# Patient Record
Sex: Male | Born: 1945 | Race: White | Hispanic: No | Marital: Single
Health system: Southern US, Community
[De-identification: ages and names within clinical notes are randomized; demographics above are authoritative.]

## PROBLEM LIST (undated history)

## (undated) DIAGNOSIS — M1990 Unspecified osteoarthritis, unspecified site: Secondary | ICD-10-CM

## (undated) DIAGNOSIS — R569 Unspecified convulsions: Secondary | ICD-10-CM

## (undated) DIAGNOSIS — I10 Essential (primary) hypertension: Secondary | ICD-10-CM

## (undated) DIAGNOSIS — K219 Gastro-esophageal reflux disease without esophagitis: Secondary | ICD-10-CM

## (undated) DIAGNOSIS — C649 Malignant neoplasm of unspecified kidney, except renal pelvis: Secondary | ICD-10-CM

## (undated) HISTORY — PX: NEPHRECTOMY: SHX65

## (undated) HISTORY — PX: TONSILLECTOMY: SUR1361

---

## 2011-10-16 ENCOUNTER — Emergency Department (INDEPENDENT_AMBULATORY_CARE_PROVIDER_SITE_OTHER): Payer: Medicare Other

## 2011-10-16 ENCOUNTER — Emergency Department (HOSPITAL_BASED_OUTPATIENT_CLINIC_OR_DEPARTMENT_OTHER)
Admission: EM | Admit: 2011-10-16 | Discharge: 2011-10-16 | Disposition: A | Discharge status: Home / Self Care | Payer: Medicare Other | Attending: Emergency Medicine | Admitting: Emergency Medicine

## 2011-10-16 ENCOUNTER — Encounter: Payer: Self-pay | Admitting: Student

## 2011-10-16 DIAGNOSIS — R059 Cough, unspecified: Secondary | ICD-10-CM | POA: Insufficient documentation

## 2011-10-16 DIAGNOSIS — R05 Cough: Secondary | ICD-10-CM | POA: Insufficient documentation

## 2011-10-16 DIAGNOSIS — J40 Bronchitis, not specified as acute or chronic: Secondary | ICD-10-CM | POA: Insufficient documentation

## 2011-10-16 DIAGNOSIS — IMO0001 Reserved for inherently not codable concepts without codable children: Secondary | ICD-10-CM | POA: Insufficient documentation

## 2011-10-16 DIAGNOSIS — F17200 Nicotine dependence, unspecified, uncomplicated: Secondary | ICD-10-CM | POA: Insufficient documentation

## 2011-10-16 HISTORY — DX: Malignant neoplasm of unspecified kidney, except renal pelvis: C64.9

## 2011-10-16 HISTORY — DX: Unspecified convulsions: R56.9

## 2011-10-16 HISTORY — DX: Unspecified osteoarthritis, unspecified site: M19.90

## 2011-10-16 HISTORY — DX: Gastro-esophageal reflux disease without esophagitis: K21.9

## 2011-10-16 HISTORY — DX: Essential (primary) hypertension: I10

## 2011-10-16 MED ORDER — HYDROCOD POLST-CHLORPHEN POLST 10-8 MG/5ML PO LQCR: 5.0000 mL | Freq: Two times a day (BID) | ORAL | Status: DC | PRN

## 2011-10-16 NOTE — ED Notes (Signed)
Cough, congestion and URI s/sx since Sunday. Reports body aches chills and generalized flu like s/sx.

## 2011-10-16 NOTE — ED Provider Notes (Signed)
History     CSN: [id number] Arrival date & time: 10/16/2011  6:43 PM   First MD Initiated Contact with Patient 10/16/11 1848      Chief Complaint  Patient presents with  . URI    (Consider location/radiation/quality/duration/timing/severity/associated sxs/prior treatment) Patient is a 65 y.o. male presenting with URI. The history is provided by the patient. No language interpreter was used.  URI The primary symptoms include sore throat, cough and myalgias. Primary symptoms do not include fever, nausea or vomiting. The current episode started 2 days ago. This is a new problem. The problem has not changed since onset. Symptoms associated with the illness include congestion.    Past Medical History  Diagnosis Date  . Hypertension   . GERD (gastroesophageal reflux disease)   . Seizure   . Glaucoma   . Arthritis   . Renal cancer     Past Surgical History  Procedure Date  . Tonsillectomy   . Nephrectomy     Right Kidney    History reviewed. No pertinent family history.  History  Substance Use Topics  . Smoking status: Current Everyday Smoker  . Smokeless tobacco: Not on file  . Alcohol Use: Yes      Review of Systems  Constitutional: Negative for fever.  HENT: Positive for congestion and sore throat.   Respiratory: Positive for cough.   Gastrointestinal: Negative for nausea and vomiting.  Musculoskeletal: Positive for myalgias.  All other systems reviewed and are negative.    Allergies  Penicillins  Home Medications  No current outpatient prescriptions on file.  BP 139/76  Pulse 83  Temp(Src) 98.9 F (37.2 C) (Oral)  Resp 20  Wt 165 lb (74.844 kg)  SpO2 97%  Physical Exam  Nursing note and vitals reviewed. Constitutional: He is oriented to person, place, and time. He appears well-developed and well-nourished.  HENT:  Head: Normocephalic and atraumatic.  Right Ear: External ear normal.  Left Ear: External ear normal.  Cardiovascular: Normal  rate and regular rhythm.   Pulmonary/Chest: Effort normal and breath sounds normal.       Persistent coughing noted on exam  Musculoskeletal: Normal range of motion.  Neurological: He is alert and oriented to person, place, and time.  Skin: Skin is warm and dry.  Psychiatric: He has a normal mood and affect.    ED Course  Procedures (including critical care time)  Labs Reviewed - No data to display Dg Chest 2 View  10/16/2011  *RADIOLOGY REPORT*  Clinical Data: Cough.  CHEST - 2 VIEW  Comparison: None  Findings: The cardiac silhouette, mediastinal and hilar contours are within normal limits.  There are mild bronchitic type lung changes which could reflect bronchitis.  No focal infiltrates, edema or effusions.  The bony thorax is intact.  IMPRESSION: Bronchitic lung changes suggesting bronchitis.  No focal infiltrates.  Original Report Authenticated By: P. Loralie Champagne, M.D.     1. Bronchitis       MDM  pts vital stable:will treat for bronchitis        Teressa Lower, NP 10/16/11 1930  Teressa Lower, NP 10/16/11 1931

## 2011-10-17 NOTE — ED Provider Notes (Signed)
Medical screening examination/treatment/procedure(s) were performed by non-physician practitioner and as supervising physician I was immediately available for consultation/collaboration.    Desera Graffeo, MD 10/17/11 0021 

## 2011-12-10 ENCOUNTER — Encounter (HOSPITAL_BASED_OUTPATIENT_CLINIC_OR_DEPARTMENT_OTHER): Payer: Self-pay | Admitting: Family Medicine

## 2011-12-10 ENCOUNTER — Emergency Department (HOSPITAL_BASED_OUTPATIENT_CLINIC_OR_DEPARTMENT_OTHER)
Admission: EM | Admit: 2011-12-10 | Discharge: 2011-12-10 | Disposition: A | Discharge status: Home / Self Care | Payer: Medicare Other | Attending: Emergency Medicine | Admitting: Emergency Medicine

## 2011-12-10 ENCOUNTER — Emergency Department (INDEPENDENT_AMBULATORY_CARE_PROVIDER_SITE_OTHER): Payer: Medicare Other

## 2011-12-10 DIAGNOSIS — M549 Dorsalgia, unspecified: Secondary | ICD-10-CM | POA: Insufficient documentation

## 2011-12-10 DIAGNOSIS — M545 Low back pain: Secondary | ICD-10-CM

## 2011-12-10 DIAGNOSIS — F17200 Nicotine dependence, unspecified, uncomplicated: Secondary | ICD-10-CM | POA: Insufficient documentation

## 2011-12-10 DIAGNOSIS — Z8739 Personal history of other diseases of the musculoskeletal system and connective tissue: Secondary | ICD-10-CM | POA: Insufficient documentation

## 2011-12-10 DIAGNOSIS — K219 Gastro-esophageal reflux disease without esophagitis: Secondary | ICD-10-CM | POA: Insufficient documentation

## 2011-12-10 DIAGNOSIS — I10 Essential (primary) hypertension: Secondary | ICD-10-CM | POA: Insufficient documentation

## 2011-12-10 DIAGNOSIS — Z85528 Personal history of other malignant neoplasm of kidney: Secondary | ICD-10-CM

## 2011-12-10 MED ORDER — OXYCODONE-ACETAMINOPHEN 5-325 MG PO TABS: 2.0000 | ORAL_TABLET | ORAL | Status: AC | PRN

## 2011-12-10 MED ORDER — DIAZEPAM 5 MG PO TABS: 5.0000 mg | ORAL_TABLET | Freq: Two times a day (BID) | ORAL | Status: AC

## 2011-12-10 MED ORDER — IBUPROFEN 800 MG PO TABS: 800.0000 mg | ORAL_TABLET | Freq: Three times a day (TID) | ORAL | Status: AC

## 2011-12-10 MED ORDER — KETOROLAC TROMETHAMINE 60 MG/2ML IM SOLN
60.0000 mg | Freq: Once | INTRAMUSCULAR | Status: AC
  Administered 2011-12-10: 60 mg via INTRAMUSCULAR
  Filled 2011-12-10: qty 2

## 2011-12-10 MED ORDER — OXYCODONE-ACETAMINOPHEN 5-325 MG PO TABS
2.0000 | ORAL_TABLET | Freq: Once | ORAL | Status: AC
  Administered 2011-12-10: 2 via ORAL
  Filled 2011-12-10: qty 2

## 2011-12-10 MED ORDER — DIAZEPAM 5 MG PO TABS
5.0000 mg | ORAL_TABLET | Freq: Once | ORAL | Status: AC
  Administered 2011-12-10: 5 mg via ORAL
  Filled 2011-12-10: qty 1

## 2011-12-10 NOTE — ED Notes (Signed)
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

## 2011-12-10 NOTE — ED Provider Notes (Signed)
History   This chart was scribed for Glynn Octave, MD by Melba Coon. The patient was seen in room MH08/MH08 and the patient's care was started at 6:20PM.    CSN: [id number]  Arrival date & time 12/10/11  1800   First MD Initiated Contact with Patient 12/10/11 1814      Chief Complaint  Patient presents with  . Back Pain    (Consider location/radiation/quality/duration/timing/severity/associated sxs/prior treatment) HPI Peter Sharp is a 66 y.o. male who presents to the Emergency Department complaining of constant, non-radiating, moderate to severe lower right back pain with an onset 2 days ago. Pt woke up the morning of onset and could not physically get out of bed without pain in back. Pt states that he has had similar pain before about 30 years when he was lifting heavy items. Pt's occupation involves lots of standing, movement and energy. Laying down and ice pads makes it better. Pt is ambulatory with pain. No falls, recent injuries, n/v/d, chest pain, stomach pain, buttock pain, leg pain, fever, or loss of bowel movements. Pt has a Hx of GERD, glaucoma, right kidney CA (localized; no recent CA treatment (6 month checkups)).   Past Medical History  Diagnosis Date  . Hypertension   . GERD (gastroesophageal reflux disease)   . Seizure   . Glaucoma   . Arthritis   . Renal cancer     Past Surgical History  Procedure Date  . Tonsillectomy   . Nephrectomy     Right Kidney    No family history on file.  History  Substance Use Topics  . Smoking status: Current Everyday Smoker  . Smokeless tobacco: Not on file  . Alcohol Use: Yes      Review of Systems 10 Systems reviewed and are negative for acute change except as noted in the HPI.  Allergies  Penicillins  Home Medications   Current Outpatient Rx  Name Route Sig Dispense Refill  . PRESCRIPTION MEDICATION  Patient takes unknown glaucoma, seizure, high blood pressure, arthritis and acid reflux medications     . DIAZEPAM 5 MG PO TABS Oral Take 1 tablet (5 mg total) by mouth 2 (two) times daily. 10 tablet 0  . IBUPROFEN 800 MG PO TABS Oral Take 1 tablet (800 mg total) by mouth 3 (three) times daily. 21 tablet 0  . OXYCODONE-ACETAMINOPHEN 5-325 MG PO TABS Oral Take 2 tablets by mouth every 4 (four) hours as needed for pain. 15 tablet 0    BP 155/87  Pulse 108  Temp(Src) 98.2 F (36.8 C) (Oral)  Resp 18  Ht 5\' 6"  (1.676 m)  Wt 170 lb (77.111 kg)  BMI 27.44 kg/m2  SpO2 99%  Physical Exam  Constitutional: He is oriented to person, place, and time. He appears well-developed and well-nourished.  HENT:  Head: Normocephalic and atraumatic.  Eyes: Conjunctivae and EOM are normal. Pupils are equal, round, and reactive to light. No scleral icterus.  Neck: Normal range of motion. Neck supple. No thyromegaly present.  Cardiovascular: Normal rate, regular rhythm and normal heart sounds.  Exam reveals no gallop and no friction rub.   No murmur heard. Pulmonary/Chest: Effort normal and breath sounds normal. No stridor. He has no wheezes. He has no rales. He exhibits no tenderness.  Abdominal: Soft. He exhibits no distension. There is no tenderness. There is no rebound.  Musculoskeletal: Normal range of motion. He exhibits tenderness (Rt paraspinal tenderness). He exhibits no edema.       Good dorsiflexion 2+  patellar and achilles reflexes No CVA tenderness Good PTs and DTs   Lymphadenopathy:    He has no cervical adenopathy.  Neurological: He is oriented to person, place, and time. Coordination normal.  Skin: No rash noted. No erythema.  Psychiatric: He has a normal mood and affect. His behavior is normal.    ED Course  Procedures (including critical care time)  DIAGNOSTIC STUDIES: Oxygen Saturation is 99% on room air, normal by my interpretation.    COORDINATION OF CARE:  7:17PM - EDMD informs pt of nml XRs; pt states that back pain is better than previously; plan for d/c; pt  agrees   Labs Reviewed - No data to display Dg Lumbar Spine Complete  12/10/2011  *RADIOLOGY REPORT*  Clinical Data: Low back pain since Saturday.  History renal cancer.  LUMBAR SPINE - COMPLETE 4+ VIEW  Comparison: Chest film of 10/16/2011.  Findings: Five lumbar type vertebral bodies.   Surgical clips in the right side of the abdomen. Minimal  wedging of T11 and T12 suspected.  Lumbar vertebral body height is maintained.  Expected for age lower lumbar spondylosis, including endplate osteophytes at L4-L5 facet arthropathy.  IMPRESSION:  1.  No acute findings in the lumbar spine.  Normal for age spondylosis. 2.  Minimal wedging of the T11 and T12 vertebral bodies suspected. Suboptimally evaluated secondary to the film being centered over the mid lumbar spine.  Correlate with thoracolumbar junction symptoms.  These may be present on the 10/16/2011 exam.  No canal compromise.  Original Report Authenticated By: Consuello Bossier, M.D.     1. Back pain       MDM  Right paraspinal low back pain for the past 3 days. Pain does not radiate he is not had back pain for 30 years but thinks he might of done something while working outside. Neurological intact,  We'll obtain plain film imaging given remote kidney cancer history.  Pain improved medications. Patient is ambulatory. No evidence of metastases on Xray.   I personally performed the services described in this documentation, which was scribed in my presence.  The recorded information has been reviewed and considered.      Glynn Octave, MD 12/11/11 [phone]

## 2011-12-10 NOTE — ED Notes (Addendum)
Pt c/o low right back pain since Saturday. Pt able to ambulate with difficulty. Pt reports h/o back pain.

## 2020-02-16 ENCOUNTER — Emergency Department (HOSPITAL_COMMUNITY): Payer: No Typology Code available for payment source

## 2020-02-16 ENCOUNTER — Inpatient Hospital Stay (HOSPITAL_COMMUNITY)
Admission: EM | Admit: 2020-02-16 | Discharge: 2020-02-19 | DRG: 084 | Disposition: A | Discharge status: Home / Self Care | Payer: No Typology Code available for payment source | Attending: Surgery | Admitting: Surgery

## 2020-02-16 DIAGNOSIS — I619 Nontraumatic intracerebral hemorrhage, unspecified: Secondary | ICD-10-CM | POA: Diagnosis not present

## 2020-02-16 DIAGNOSIS — Y93E1 Activity, personal bathing and showering: Secondary | ICD-10-CM

## 2020-02-16 DIAGNOSIS — Y92002 Bathroom of unspecified non-institutional (private) residence single-family (private) house as the place of occurrence of the external cause: Secondary | ICD-10-CM

## 2020-02-16 DIAGNOSIS — G4733 Obstructive sleep apnea (adult) (pediatric): Secondary | ICD-10-CM | POA: Diagnosis present

## 2020-02-16 DIAGNOSIS — I10 Essential (primary) hypertension: Secondary | ICD-10-CM | POA: Diagnosis present

## 2020-02-16 DIAGNOSIS — Z95810 Presence of automatic (implantable) cardiac defibrillator: Secondary | ICD-10-CM

## 2020-02-16 DIAGNOSIS — S0181XA Laceration without foreign body of other part of head, initial encounter: Secondary | ICD-10-CM | POA: Diagnosis present

## 2020-02-16 DIAGNOSIS — T1490XA Injury, unspecified, initial encounter: Secondary | ICD-10-CM

## 2020-02-16 DIAGNOSIS — Z85528 Personal history of other malignant neoplasm of kidney: Secondary | ICD-10-CM

## 2020-02-16 DIAGNOSIS — S0232XA Fracture of orbital floor, left side, initial encounter for closed fracture: Secondary | ICD-10-CM | POA: Diagnosis present

## 2020-02-16 DIAGNOSIS — Z96653 Presence of artificial knee joint, bilateral: Secondary | ICD-10-CM | POA: Diagnosis present

## 2020-02-16 DIAGNOSIS — Z20822 Contact with and (suspected) exposure to covid-19: Secondary | ICD-10-CM | POA: Diagnosis present

## 2020-02-16 DIAGNOSIS — S06359A Traumatic hemorrhage of left cerebrum with loss of consciousness of unspecified duration, initial encounter: Principal | ICD-10-CM | POA: Diagnosis present

## 2020-02-16 DIAGNOSIS — Z7901 Long term (current) use of anticoagulants: Secondary | ICD-10-CM

## 2020-02-16 DIAGNOSIS — J449 Chronic obstructive pulmonary disease, unspecified: Secondary | ICD-10-CM | POA: Diagnosis present

## 2020-02-16 DIAGNOSIS — Z79899 Other long term (current) drug therapy: Secondary | ICD-10-CM

## 2020-02-16 DIAGNOSIS — Z880 Allergy status to penicillin: Secondary | ICD-10-CM

## 2020-02-16 DIAGNOSIS — I4891 Unspecified atrial fibrillation: Secondary | ICD-10-CM | POA: Diagnosis present

## 2020-02-16 DIAGNOSIS — H409 Unspecified glaucoma: Secondary | ICD-10-CM | POA: Diagnosis present

## 2020-02-16 DIAGNOSIS — S01112A Laceration without foreign body of left eyelid and periocular area, initial encounter: Secondary | ICD-10-CM | POA: Diagnosis present

## 2020-02-16 LAB — CBC
HCT: 38.5 % — ABNORMAL LOW (ref 39.0–52.0)
Hemoglobin: 12.3 g/dL — ABNORMAL LOW (ref 13.0–17.0)
MCH: 35.1 pg — ABNORMAL HIGH (ref 26.0–34.0)
MCHC: 31.9 g/dL (ref 30.0–36.0)
MCV: 110 fL — ABNORMAL HIGH (ref 80.0–100.0)
Platelets: 200 10*3/uL (ref 150–400)
RBC: 3.5 MIL/uL — ABNORMAL LOW (ref 4.22–5.81)
RDW: 12.7 % (ref 11.5–15.5)
WBC: 5.5 10*3/uL (ref 4.0–10.5)
nRBC: 0 % (ref 0.0–0.2)

## 2020-02-16 LAB — I-STAT CHEM 8, ED
BUN: 26 mg/dL — ABNORMAL HIGH (ref 8–23)
Calcium, Ion: 1.14 mmol/L — ABNORMAL LOW (ref 1.15–1.40)
Chloride: 105 mmol/L (ref 98–111)
Creatinine, Ser: 1.8 mg/dL — ABNORMAL HIGH (ref 0.61–1.24)
Glucose, Bld: 105 mg/dL — ABNORMAL HIGH (ref 70–99)
HCT: 37 % — ABNORMAL LOW (ref 39.0–52.0)
Hemoglobin: 12.6 g/dL — ABNORMAL LOW (ref 13.0–17.0)
Potassium: 4 mmol/L (ref 3.5–5.1)
Sodium: 141 mmol/L (ref 135–145)
TCO2: 30 mmol/L (ref 22–32)

## 2020-02-16 LAB — SAMPLE TO BLOOD BANK

## 2020-02-16 MED ORDER — FENTANYL CITRATE (PF) 100 MCG/2ML IJ SOLN
100.0000 ug | Freq: Once | INTRAMUSCULAR | Status: AC
  Administered 2020-02-16: 100 ug via INTRAVENOUS
  Filled 2020-02-16: qty 2
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
## 2020-02-17 ENCOUNTER — Other Ambulatory Visit: Payer: Self-pay

## 2020-02-17 ENCOUNTER — Emergency Department (HOSPITAL_COMMUNITY): Payer: No Typology Code available for payment source

## 2020-02-17 ENCOUNTER — Encounter (HOSPITAL_COMMUNITY): Payer: Self-pay | Admitting: Radiology

## 2020-02-17 ENCOUNTER — Inpatient Hospital Stay (HOSPITAL_COMMUNITY): Payer: No Typology Code available for payment source

## 2020-02-17 DIAGNOSIS — G4733 Obstructive sleep apnea (adult) (pediatric): Secondary | ICD-10-CM | POA: Diagnosis present

## 2020-02-17 DIAGNOSIS — J449 Chronic obstructive pulmonary disease, unspecified: Secondary | ICD-10-CM | POA: Diagnosis present

## 2020-02-17 DIAGNOSIS — H409 Unspecified glaucoma: Secondary | ICD-10-CM | POA: Diagnosis present

## 2020-02-17 DIAGNOSIS — S0232XA Fracture of orbital floor, left side, initial encounter for closed fracture: Secondary | ICD-10-CM | POA: Diagnosis present

## 2020-02-17 DIAGNOSIS — Z20822 Contact with and (suspected) exposure to covid-19: Secondary | ICD-10-CM | POA: Diagnosis present

## 2020-02-17 DIAGNOSIS — S01112A Laceration without foreign body of left eyelid and periocular area, initial encounter: Secondary | ICD-10-CM | POA: Diagnosis present

## 2020-02-17 DIAGNOSIS — Y92002 Bathroom of unspecified non-institutional (private) residence single-family (private) house as the place of occurrence of the external cause: Secondary | ICD-10-CM | POA: Diagnosis not present

## 2020-02-17 DIAGNOSIS — I10 Essential (primary) hypertension: Secondary | ICD-10-CM | POA: Diagnosis present

## 2020-02-17 DIAGNOSIS — I619 Nontraumatic intracerebral hemorrhage, unspecified: Secondary | ICD-10-CM | POA: Diagnosis present

## 2020-02-17 DIAGNOSIS — T1490XA Injury, unspecified, initial encounter: Secondary | ICD-10-CM | POA: Diagnosis present

## 2020-02-17 DIAGNOSIS — Z96653 Presence of artificial knee joint, bilateral: Secondary | ICD-10-CM | POA: Diagnosis present

## 2020-02-17 DIAGNOSIS — I4891 Unspecified atrial fibrillation: Secondary | ICD-10-CM | POA: Diagnosis present

## 2020-02-17 DIAGNOSIS — Z95810 Presence of automatic (implantable) cardiac defibrillator: Secondary | ICD-10-CM | POA: Diagnosis not present

## 2020-02-17 DIAGNOSIS — Z880 Allergy status to penicillin: Secondary | ICD-10-CM | POA: Diagnosis not present

## 2020-02-17 DIAGNOSIS — Z7901 Long term (current) use of anticoagulants: Secondary | ICD-10-CM | POA: Diagnosis not present

## 2020-02-17 DIAGNOSIS — S0181XA Laceration without foreign body of other part of head, initial encounter: Secondary | ICD-10-CM | POA: Diagnosis present

## 2020-02-17 DIAGNOSIS — Z85528 Personal history of other malignant neoplasm of kidney: Secondary | ICD-10-CM | POA: Diagnosis not present

## 2020-02-17 DIAGNOSIS — Y93E1 Activity, personal bathing and showering: Secondary | ICD-10-CM | POA: Diagnosis not present

## 2020-02-17 DIAGNOSIS — Z79899 Other long term (current) drug therapy: Secondary | ICD-10-CM | POA: Diagnosis not present

## 2020-02-17 LAB — URINALYSIS, ROUTINE W REFLEX MICROSCOPIC
Bilirubin Urine: NEGATIVE
Glucose, UA: NEGATIVE mg/dL
Ketones, ur: NEGATIVE mg/dL
Leukocytes,Ua: NEGATIVE
Nitrite: NEGATIVE
Protein, ur: NEGATIVE mg/dL
Specific Gravity, Urine: 1.019 (ref 1.005–1.030)
pH: 5 (ref 5.0–8.0)

## 2020-02-17 LAB — PROTIME-INR
INR: 1.4 — ABNORMAL HIGH (ref 0.8–1.2)
Prothrombin Time: 16.8 seconds — ABNORMAL HIGH (ref 11.4–15.2)

## 2020-02-17 LAB — COMPREHENSIVE METABOLIC PANEL
ALT: 7 U/L (ref 0–44)
AST: 18 U/L (ref 15–41)
Albumin: 4 g/dL (ref 3.5–5.0)
Alkaline Phosphatase: 89 U/L (ref 38–126)
Anion gap: 11 (ref 5–15)
BUN: 21 mg/dL (ref 8–23)
CO2: 25 mmol/L (ref 22–32)
Calcium: 8.6 mg/dL — ABNORMAL LOW (ref 8.9–10.3)
Chloride: 103 mmol/L (ref 98–111)
Creatinine, Ser: 1.68 mg/dL — ABNORMAL HIGH (ref 0.61–1.24)
GFR calc Af Amer: 46 mL/min — ABNORMAL LOW (ref >60)
GFR calc non Af Amer: 39 mL/min — ABNORMAL LOW (ref >60)
Glucose, Bld: 106 mg/dL — ABNORMAL HIGH (ref 70–99)
Potassium: 4.1 mmol/L (ref 3.5–5.1)
Sodium: 139 mmol/L (ref 135–145)
Total Bilirubin: 0.6 mg/dL (ref 0.3–1.2)
Total Protein: 7 g/dL (ref 6.5–8.1)

## 2020-02-17 LAB — ETHANOL: Alcohol, Ethyl (B): <10 mg/dL (ref <10)

## 2020-02-17 LAB — RESPIRATORY PANEL BY RT PCR (FLU A&B, COVID)
Influenza A by PCR: NEGATIVE
Influenza B by PCR: NEGATIVE
SARS Coronavirus 2 by RT PCR: NEGATIVE

## 2020-02-17 LAB — LACTIC ACID, PLASMA: Lactic Acid, Venous: 0.8 mmol/L (ref 0.5–1.9)

## 2020-02-17 LAB — MRSA PCR SCREENING: MRSA by PCR: NEGATIVE

## 2020-02-17 MED ORDER — OXYCODONE HCL 5 MG PO TABS
5.0000 mg | ORAL_TABLET | ORAL | Status: DC | PRN
  Administered 2020-02-17: 10 mg via ORAL
  Filled 2020-02-17: qty 2

## 2020-02-17 MED ORDER — ONDANSETRON 4 MG PO TBDP: 4.0000 mg | ORAL_TABLET | Freq: Four times a day (QID) | ORAL | Status: DC | PRN

## 2020-02-17 MED ORDER — LIDOCAINE-EPINEPHRINE (PF) 2 %-1:200000 IJ SOLN
20.0000 mL | Freq: Once | INTRAMUSCULAR | Status: DC
  Filled 2020-02-17: qty 20

## 2020-02-17 MED ORDER — LEVETIRACETAM IN NACL 500 MG/100ML IV SOLN
500.0000 mg | Freq: Two times a day (BID) | INTRAVENOUS | Status: DC
  Administered 2020-02-17 – 2020-02-19 (×4): 500 mg via INTRAVENOUS
  Filled 2020-02-17 (×4): qty 100

## 2020-02-17 MED ORDER — MORPHINE SULFATE (PF) 2 MG/ML IV SOLN: 2.0000 mg | INTRAVENOUS | Status: DC | PRN

## 2020-02-17 MED ORDER — ONDANSETRON HCL 4 MG/2ML IJ SOLN: 4.0000 mg | Freq: Four times a day (QID) | INTRAMUSCULAR | Status: DC | PRN

## 2020-02-17 MED ORDER — MORPHINE SULFATE (PF) 4 MG/ML IV SOLN
4.0000 mg | Freq: Once | INTRAVENOUS | Status: AC
  Administered 2020-02-17: 4 mg via INTRAVENOUS
  Filled 2020-02-17: qty 1

## 2020-02-17 MED ORDER — DOCUSATE SODIUM 100 MG PO CAPS
100.0000 mg | ORAL_CAPSULE | Freq: Two times a day (BID) | ORAL | Status: DC
  Filled 2020-02-17 (×2): qty 1

## 2020-02-17 MED ORDER — LEVETIRACETAM IN NACL 1000 MG/100ML IV SOLN
1000.0000 mg | Freq: Once | INTRAVENOUS | Status: AC
  Administered 2020-02-17: 1000 mg via INTRAVENOUS
  Filled 2020-02-17: qty 100

## 2020-02-17 MED ORDER — METHOCARBAMOL 500 MG PO TABS
1000.0000 mg | ORAL_TABLET | Freq: Three times a day (TID) | ORAL | Status: DC
  Administered 2020-02-17 – 2020-02-19 (×6): 1000 mg via ORAL
  Filled 2020-02-17 (×6): qty 2

## 2020-02-17 MED ORDER — SODIUM CHLORIDE 0.9 % IV SOLN: INTRAVENOUS | Status: DC

## 2020-02-17 MED ORDER — ACETAMINOPHEN 500 MG PO TABS
1000.0000 mg | ORAL_TABLET | Freq: Four times a day (QID) | ORAL | Status: DC
  Administered 2020-02-17 – 2020-02-19 (×6): 1000 mg via ORAL
  Filled 2020-02-17 (×7): qty 2

## 2020-02-17 MED ORDER — TETANUS-DIPHTH-ACELL PERTUSSIS 5-2.5-18.5 LF-MCG/0.5 IM SUSP
0.5000 mL | Freq: Once | INTRAMUSCULAR | Status: AC
  Administered 2020-02-17: 0.5 mL via INTRAMUSCULAR
  Filled 2020-02-17: qty 0.5

## 2020-02-17 MED ORDER — ORAL CARE MOUTH RINSE
15.0000 mL | Freq: Two times a day (BID) | OROMUCOSAL | Status: DC
  Administered 2020-02-17 – 2020-02-18 (×2): 15 mL via OROMUCOSAL

## 2020-02-17 MED ORDER — CHLORHEXIDINE GLUCONATE CLOTH 2 % EX PADS
6.0000 | MEDICATED_PAD | Freq: Every day | CUTANEOUS | Status: DC
  Administered 2020-02-17 – 2020-02-18 (×2): 6 via TOPICAL
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
## 2020-02-17 NOTE — ED Notes (Addendum)
Phoned report to receiving RN. Will arrange transport shortly.

## 2020-02-17 NOTE — Social Work (Signed)
CSW met with pt at bedside. CSW introduced self and explained role. CSW completed SBIRT with pt. Pt scored a 4 on the Sbirt scale. Pt stated that he has 1-2 beers most days out of the week with his dinner. Pt stated in the past he was an alcoholic but was sober for 2 years and now drinks to relax. Pt stated that he does not have a problem with his alcohol use. CSW offered resources and pt declined.   Peter Sharp, Latanya Presser, [place] Licensed Clinical Social Worker [phone]
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
## 2020-02-18 NOTE — Discharge Instructions (Signed)
Head Injury, Adult There are many types of head injuries. They can be as minor as a bump. Some head injuries can be worse. Worse injuries include:  A strong hit to the head that shakes the brain back and forth causing damage (concussion).  A bruise (contusion) of the brain. This means there is bleeding in the brain that can cause swelling.  A cracked skull (skull fracture).  Bleeding in the brain that gathers, gets thick (makes a clot), and forms a bump (hematoma). Most problems from a head injury come in the first 24 hours. However, you may still have side effects up to 7-10 days after your injury. It is important to watch your condition for any changes. You may need to be watched in the emergency department or urgent care, or you may need to stay in the hospital. What are the causes? There are many possible causes of a head injury. A serious head injury may be caused by:  A car accident.  Bicycle or motorcycle accidents.  Sports injuries.  Falls. What are the signs or symptoms? Symptoms of a head injury include a bruise, bump, or bleeding where the injury happened. Other physical symptoms may include:  Headache.  Feeling sick to your stomach (nauseous) or vomiting.  Dizziness.  Feeling tired.  Being uncomfortable around bright lights or loud noises.  Shaking movements that you cannot control (seizures).  Trouble being woken up.  Passing out (fainting). Mental or emotional symptoms may include:  Feeling grumpy or cranky.  Confusion and memory problems.  Having trouble paying attention or concentrating.  Changes in eating or sleeping habits.  Feeling worried or nervous (anxious).  Feeling sad (depressed). How is this treated? Treatment for this condition depends on how severe the injury is and the type of injury you have. The main goal is to prevent complications and to allow the brain time to heal. Mild head injury If you have a mild head injury, you may be  sent home and treatment may include:  Being watched. A responsible adult should stay with you for 24 hours after your injury and check on you often.  Physical rest.  Brain rest.  Pain medicines. Severe head injury If you have a severe head injury, treatment may include:  Being watched closely. This includes hospitalization with frequent physical exams.  Medicines to: ? Help with pain. ? Prevent shaking movements that you cannot control. ? Help with brain swelling.  Using a machine that helps you breathe (ventilator).  Treatments to manage the swelling inside the brain.  Brain surgery. This may be needed to: ? Remove a blood clot. ? Stop the bleeding. ? Remove a part of the skull. This allows room for the brain to swell. Follow these instructions at home: Activity  Rest.  Avoid activities that are hard or tiring.  Make sure you get enough sleep.  Limit activities that need a lot of thought or attention, such as: ? Watching TV. ? Playing memory games and puzzles. ? Job-related work or homework. ? Working on the computer, social media, and texting.  Avoid activities that could cause another head injury until your doctor says it is okay. This includes playing sports. Having another head injury, especially before the first one has healed, can be dangerous.  Ask your doctor when it is safe for you to go back to your normal activities, such as work or school. Ask your doctor for a step-by-step plan for slowly going back to your normal activities.  Ask   your doctor when you can drive, ride a bicycle, or use heavy machinery. Do not do these activities if you are dizzy. Lifestyle   Do not drink alcohol until your doctor says it is okay.  Do not use drugs.  If it is harder than usual to remember things, write them down.  If you are easily distracted, try to do one thing at a time.  Talk with family members or close friends when making important decisions.  Tell your  friends, family, a trusted coworker, and work manager about your injury, symptoms, and limits (restrictions). Have them watch for any problems that are new or getting worse. General instructions  Take over-the-counter and prescription medicines only as told by your doctor.  Have someone stay with you for 24 hours after your head injury. This person should watch you for any changes in your symptoms and be ready to get help.  Keep all follow-up visits as told by your doctor. This is important. How is this prevented?  Work on your balance and strength. This can help you avoid falls.  Wear a seatbelt when you are in a moving vehicle.  Wear a helmet when you: ? Ride a bicycle. ? Ski. ? Do any other sport or activity that has a risk of injury.  If you drink alcohol: ? Limit how much you use to:  0-1 drink a day for women.  0-2 drinks a day for men. ? Be aware of how much alcohol is in your drink. In the [place] one drink equals one 12 oz bottle of beer (355 mL), one 5 oz glass of wine (148 mL), or one 1 oz glass of hard liquor (44 mL).  Make your home safer by: ? Getting rid of clutter from the floors and stairs. This includes things that can make you trip. ? Using grab bars in bathrooms and handrails by stairs. ? Placing non-slip mats on floors and in bathtubs. ? Putting more light in dim areas. Get help right away if:  You have: ? A very bad headache that is not helped by medicine. ? Trouble walking or weakness in your arms and legs. ? Clear or bloody fluid coming from your nose or ears. ? Changes in how you see (vision). ? Shaking movements that you cannot control.  You lose your balance.  You vomit.  The black centers of your eyes (pupils) change in size.  Your speech is slurred.  Your dizziness gets worse.  You pass out.  You are sleepier than normal and have trouble staying awake.  Your symptoms get worse. These symptoms may be an emergency. Do not wait to see  if the symptoms will go away. Get medical help right away. Call your local emergency services (911 in the [place] Do not drive yourself to the hospital. Summary  There are many types of head injuries. They can be as minor as a bump. Some head injuries can be worse  Treatment for this condition depends on how severe the injury is and the type of injury you have.  Ask your doctor when it is safe for you to go back to your normal activities, such as work or school.  To prevent a head injury, wear a seat belt in a car, wear a helmet when you use a a bicycle, limit your alcohol use, and make your home safer. This information is not intended to replace advice given to you by your health care provider. Make sure you discuss any questions you   have with your health care provider. Document Revised: 02/26/2019 Document Reviewed: 11/28/2018 Elsevier Patient Education  2020 [employer]  
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
## 2020-02-19 NOTE — Progress Notes (Signed)
Peter Sharp Sr. to be D/C'd  per MD order. Discussed with the patient and all questions fully answered.  VSS, Skin clean, dry and intact without evidence of skin break down, no evidence of skin tears noted.  IV catheter discontinued intact. Site without signs and symptoms of complications. Dressing and pressure applied.  An After Visit Summary was printed and given to the patient. Patient received prescription.  D/c education completed with patient/family including follow up instructions, medication list, d/c activities limitations if indicated, with other d/c instructions as indicated by MD - patient able to verbalize understanding, all questions fully answered.   Patient instructed to return to ED, call 911, or call MD for any changes in condition.   Patient to be escorted via [place], and D/C home via private auto.
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
## 2020-02-19 NOTE — Plan of Care (Signed)
  Problem: Education: Goal: Knowledge of disease or condition will improve Outcome: Progressing   Problem: Skin Integrity: Goal: Risk for impaired skin integrity will decrease Outcome: Progressing Goal: Demonstration of wound healing without infection will improve Outcome: Progressing   Problem: Education: Goal: Knowledge of General Education information will improve Description: Including pain rating scale, medication(s)/side effects and non-pharmacologic comfort measures Outcome: Progressing   Problem: Health Behavior/Discharge Planning: Goal: Ability to manage health-related needs will improve Outcome: Progressing   Problem: Clinical Measurements: Goal: Ability to maintain clinical measurements within normal limits will improve Outcome: Progressing Goal: Will remain free from infection Outcome: Progressing Goal: Diagnostic test results will improve Outcome: Progressing Goal: Respiratory complications will improve Outcome: Progressing Goal: Cardiovascular complication will be avoided Outcome: Progressing   Problem: Activity: Goal: Risk for activity intolerance will decrease Outcome: Progressing   Problem: Nutrition: Goal: Adequate nutrition will be maintained Outcome: Progressing   Problem: Coping: Goal: Level of anxiety will decrease Outcome: Progressing   Problem: Elimination: Goal: Will not experience complications related to bowel motility Outcome: Progressing Goal: Will not experience complications related to urinary retention Outcome: Progressing   Problem: Pain Managment: Goal: General experience of comfort will improve Outcome: Progressing   Problem: Safety: Goal: Ability to remain free from injury will improve Outcome: Progressing   Problem: Skin Integrity: Goal: Risk for impaired skin integrity will decrease Outcome: Progressing

## 2020-02-19 NOTE — Plan of Care (Signed)
  Problem: Education: Goal: Knowledge of disease or condition will improve Outcome: Completed/Met   Problem: Skin Integrity: Goal: Risk for impaired skin integrity will decrease Outcome: Completed/Met Goal: Demonstration of wound healing without infection will improve Outcome: Completed/Met   Problem: Education: Goal: Knowledge of General Education information will improve Description: Including pain rating scale, medication(s)/side effects and non-pharmacologic comfort measures Outcome: Completed/Met   Problem: Health Behavior/Discharge Planning: Goal: Ability to manage health-related needs will improve Outcome: Completed/Met   Problem: Clinical Measurements: Goal: Ability to maintain clinical measurements within normal limits will improve Outcome: Completed/Met Goal: Will remain free from infection Outcome: Completed/Met Goal: Diagnostic test results will improve Outcome: Completed/Met Goal: Respiratory complications will improve Outcome: Completed/Met Goal: Cardiovascular complication will be avoided Outcome: Completed/Met   Problem: Activity: Goal: Risk for activity intolerance will decrease Outcome: Completed/Met   Problem: Nutrition: Goal: Adequate nutrition will be maintained Outcome: Completed/Met   Problem: Coping: Goal: Level of anxiety will decrease Outcome: Completed/Met   Problem: Elimination: Goal: Will not experience complications related to bowel motility Outcome: Completed/Met Goal: Will not experience complications related to urinary retention Outcome: Completed/Met   Problem: Pain Managment: Goal: General experience of comfort will improve Outcome: Completed/Met   Problem: Safety: Goal: Ability to remain free from injury will improve Outcome: Completed/Met   Problem: Skin Integrity: Goal: Risk for impaired skin integrity will decrease Outcome: Completed/Met

## 2020-02-22 ENCOUNTER — Encounter (HOSPITAL_BASED_OUTPATIENT_CLINIC_OR_DEPARTMENT_OTHER): Payer: Self-pay | Admitting: Family Medicine
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
